# Patient Record
Sex: Male | Born: 1955 | Race: White | Hispanic: No | Marital: Married | Smoking: Never smoker
Health system: Southern US, Community
[De-identification: ages and names within clinical notes are randomized; demographics above are authoritative.]

## PROBLEM LIST (undated history)

## (undated) DIAGNOSIS — R569 Unspecified convulsions: Secondary | ICD-10-CM

## (undated) DIAGNOSIS — I639 Cerebral infarction, unspecified: Secondary | ICD-10-CM

---

## 2005-12-28 ENCOUNTER — Ambulatory Visit: Payer: Self-pay | Admitting: Unknown Physician Specialty

## 2007-08-23 ENCOUNTER — Other Ambulatory Visit: Payer: Self-pay

## 2007-08-23 ENCOUNTER — Inpatient Hospital Stay: Payer: Self-pay | Admitting: Internal Medicine

## 2008-09-21 ENCOUNTER — Emergency Department: Payer: Self-pay | Admitting: Emergency Medicine

## 2010-03-28 ENCOUNTER — Ambulatory Visit: Payer: Self-pay | Admitting: Internal Medicine

## 2010-08-26 ENCOUNTER — Emergency Department: Payer: Self-pay | Admitting: Internal Medicine

## 2016-03-30 ENCOUNTER — Emergency Department
Admission: EM | Admit: 2016-03-30 | Discharge: 2016-03-30 | Disposition: A | Discharge status: Other Acute Inpatient Hospital | Payer: 59 | Attending: Emergency Medicine | Admitting: Emergency Medicine

## 2016-03-30 ENCOUNTER — Encounter: Payer: Self-pay | Admitting: *Deleted

## 2016-03-30 ENCOUNTER — Emergency Department: Payer: 59

## 2016-03-30 DIAGNOSIS — I7103 Dissection of thoracoabdominal aorta: Secondary | ICD-10-CM | POA: Insufficient documentation

## 2016-03-30 DIAGNOSIS — R55 Syncope and collapse: Secondary | ICD-10-CM | POA: Diagnosis present

## 2016-03-30 HISTORY — DX: Cerebral infarction, unspecified: I63.9

## 2016-03-30 HISTORY — DX: Unspecified convulsions: R56.9

## 2016-03-30 LAB — BASIC METABOLIC PANEL
ANION GAP: 3 — AB (ref 5–15)
BUN: 15 mg/dL (ref 6–20)
CALCIUM: 9 mg/dL (ref 8.9–10.3)
CO2: 26 mmol/L (ref 22–32)
Chloride: 108 mmol/L (ref 101–111)
Creatinine, Ser: 0.86 mg/dL (ref 0.61–1.24)
GFR calc Af Amer: >60 mL/min (ref >60)
GFR calc non Af Amer: >60 mL/min (ref >60)
GLUCOSE: 104 mg/dL — AB (ref 65–99)
Potassium: 4.5 mmol/L (ref 3.5–5.1)
Sodium: 137 mmol/L (ref 135–145)

## 2016-03-30 LAB — CBC
HEMATOCRIT: 49 % (ref 40.0–52.0)
HEMOGLOBIN: 16.5 g/dL (ref 13.0–18.0)
MCH: 34.7 pg — ABNORMAL HIGH (ref 26.0–34.0)
MCHC: 33.7 g/dL (ref 32.0–36.0)
MCV: 103 fL — ABNORMAL HIGH (ref 80.0–100.0)
Platelets: 141 10*3/uL — ABNORMAL LOW (ref 150–440)
RBC: 4.75 MIL/uL (ref 4.40–5.90)
RDW: 13.7 % (ref 11.5–14.5)
WBC: 11.2 10*3/uL — ABNORMAL HIGH (ref 3.8–10.6)

## 2016-03-30 LAB — HEPATIC FUNCTION PANEL
ALK PHOS: 80 U/L (ref 38–126)
ALT: 31 U/L (ref 17–63)
AST: 29 U/L (ref 15–41)
Albumin: 4.2 g/dL (ref 3.5–5.0)
BILIRUBIN DIRECT: 0.2 mg/dL (ref 0.1–0.5)
BILIRUBIN INDIRECT: 0.5 mg/dL (ref 0.3–0.9)
BILIRUBIN TOTAL: 0.7 mg/dL (ref 0.3–1.2)
Total Protein: 6.7 g/dL (ref 6.5–8.1)

## 2016-03-30 LAB — TROPONIN I

## 2016-03-30 MED ORDER — FENTANYL CITRATE (PF) 100 MCG/2ML IJ SOLN
25.0000 ug | Freq: Once | INTRAMUSCULAR | Status: AC
  Administered 2016-03-30: 25 ug via INTRAVENOUS
  Filled 2016-03-30: qty 2

## 2016-03-30 MED ORDER — ASPIRIN 81 MG PO CHEW
324.0000 mg | CHEWABLE_TABLET | Freq: Once | ORAL | Status: AC
  Administered 2016-03-30: 324 mg via ORAL
  Filled 2016-03-30: qty 4

## 2016-03-30 MED ORDER — IOPAMIDOL (ISOVUE-370) INJECTION 76%
75.0000 mL | Freq: Once | INTRAVENOUS | Status: AC | PRN
  Administered 2016-03-30: 75 mL via INTRAVENOUS

## 2016-03-30 MED ORDER — SODIUM CHLORIDE 0.9 % IV SOLN
Freq: Once | INTRAVENOUS | Status: AC
  Administered 2016-03-30: 12:00:00 via INTRAVENOUS

## 2016-03-30 NOTE — Progress Notes (Signed)
The Nurse in the ED asked Scott Carr to visit the Pt. Pt suffering from chest pain, wife bedside. Pt transferred to [facility]. [place] provided prayer and presence.    03/30/16 1600  Clinical Encounter Type  Visited With Patient;Patient and family together  Visit Type Initial  Referral From Nurse  Consult/Referral To Chaplain  Spiritual Encounters  Spiritual Needs Prayer

## 2016-03-30 NOTE — ED Triage Notes (Signed)
States this AM he felt a tightness in his throat and then it radiated down to his chest, states he then had a near syncopal episode, states he got up with wifes help and then felt as if he was going to have another near syncopal episode, upon arrival states chest tightness and feeling weak

## 2016-03-30 NOTE — ED Provider Notes (Signed)
[facility] Emergency Department Provider Note  ____________________________________________   First MD Initiated Contact with Patient 03/30/16 1210     (approximate)  I have reviewed the triage vital signs and the nursing notes.   HISTORY  Chief Complaint Chest Pain and Loss of Consciousness   HPI Scott Carr is a 61 y.o. male with a history of CVA as well as seizures on Aggrenox was presented to the emergency department today with an episode of chest pain and near syncope. 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 He says that he felt like he was going to pass out and became very sweaty. There was no vomiting. He says that his pain is still a 6 out of 10 at this time and worse with deep breathing. He says the last time he took his Aggrenox last night which when he typically takes his medicines. He denies any radiation to the back or to the arms. Says that he has bilateral lower extremity swelling.   Past Medical History:  Diagnosis Date  . CVA (cerebral vascular accident) ([place])   . Seizures ([place])     There are no active problems to display for this patient.   History reviewed. No pertinent surgical history.  Prior to Admission medications   Not on File    Allergies Patient has no known allergies.  History reviewed. No pertinent family history.  Social History Social History  Substance Use Topics  . Smoking status: Never Smoker  . Smokeless tobacco: Never Used  . Alcohol use No    Review of Systems Constitutional: No fever/chills Eyes: No visual changes. ENT: No sore throat. Cardiovascular: As above Respiratory: As above Gastrointestinal: No abdominal pain.  No nausea, no vomiting.  No diarrhea.  No constipation. Genitourinary: Negative for dysuria. Musculoskeletal: Negative for back pain. Skin: Negative for rash. Neurological: Negative  for headaches, focal weakness or numbness.  10-point ROS otherwise negative.  ____________________________________________   PHYSICAL EXAM:  VITAL SIGNS: ED Triage Vitals [03/30/16 1150]  Enc Vitals Group     BP (!) 98/53     Pulse Rate 67     Resp 18     Temp 97.8 F (36.6 C)     Temp Source Oral     SpO2 99 %     Weight 198 lb (89.8 kg)     Height 6' (1.829 m)     Head Circumference      Peak Flow      Pain Score 6     Pain Loc      Pain Edu?      Excl. in [place]?     Constitutional: Alert and oriented. Well appearing and in no acute distress. Eyes: Conjunctivae are normal. PERRL. EOMI. Head: Atraumatic. Nose: No congestion/rhinnorhea. Mouth/Throat: Mucous membranes are moist.   Neck: No stridor.   Cardiovascular: Normal rate, regular rhythm. Grossly normal heart sounds.  Good peripheral circulation Is equal bilateral radial as well as dorsalis pedis pulses.  Respiratory: Normal respiratory effort.  No retractions. Lungs CTAB. Gastrointestinal: Soft and nontender. No distention.  Musculoskeletal: No lower extremity tenderness nor edema.  No joint effusions. Neurologic:  Normal speech and language. No gross focal neurologic deficits are appreciated. Skin:  Skin is warm, dry and intact. No rash noted. Psychiatric: Mood and affect are normal. Speech and behavior are normal.  ____________________________________________   LABS (all  labs ordered are listed, but only abnormal results are displayed)  Labs Reviewed  BASIC METABOLIC PANEL - Abnormal; Notable for the following:       Result Value   Glucose, Bld 104 (*)    Anion gap 3 (*)    All other components within normal limits  CBC - Abnormal; Notable for the following:    WBC 11.2 (*)    MCV 103.0 (*)    MCH 34.7 (*)    Platelets 141 (*)    All other components within normal limits  TROPONIN I  HEPATIC FUNCTION PANEL   ____________________________________________  EKG  ED ECG REPORT I, Doran Stabler, the attending physician, personally viewed and interpreted this ECG.   Date: 03/30/2016  EKG Time: 1152  Rate: 65  Rhythm: normal sinus rhythm  Axis: Normal axis  Intervals: Normal  ST&T Change: No ST elevation or depression. No abnormal T-wave inversion.  ____________________________________________  RADIOLOGY    CT Angio Chest PE W and/or Wo Contrast (Final result)  Result time 03/30/16 13:58:15  Final result by San Morelle, MD (03/30/16 13:58:15)           Narrative:   CLINICAL DATA: Chest tightness. Weakness. Near syncopal episode.  The initial CTA chest the pulmonary arteries was reviewed with suspicion of aortic dissection. The patient was then brought back to the scanner for  EXAM: CT ANGIOGRAPHY CHEST WITH CONTRAST  TECHNIQUE: Multidetector CT imaging of the chest was performed using the standard protocol during bolus administration of intravenous contrast. Multiplanar CT image reconstructions and MIPs were obtained to evaluate the vascular anatomy.  CONTRAST: 150 mL Isovue 370  COMPARISON: Two-view chest x-[place] from the same day.  FINDINGS: Cardiovascular: The heart size is normal. A type a aortic dissection is present. The dissection begins just above the aortic valves. And extends into the great vessels with involvement of the innominate artery, the left internal carotid artery, and the left subclavian artery. The vertebral arteries both fill. The left vertebral artery fills from the native lumen. The dissection extends most distally in the left subclavian artery.  Displace calcifications are present in the descending thoracic aorta. The dissection extends into the abdomen. The celiac artery and superior mesenteric artery fill from the native lumen.  There is no leak.  The ascending aorta is aneurysmal, measuring 5.2 cm.  There is no pulmonary embolus.  Mediastinum/Nodes: No significant mediastinal or axillary adenopathy is  present.  Lungs/Pleura: Lungs are clear. Minimal dependent atelectasis is present bilaterally. There is no pneumothorax. No focal airspace disease is present. There is no nodule or mass lesion.  Upper Abdomen: A simple cyst is present at the upper pole of the left kidney measuring 2.2 cm. No other focal lesions are evident.  Musculoskeletal: Mild degenerative changes are present in thoracic spine. Superior endplate Schmorl's nodes or remote superior endplate fractures are present T4, T5, and T6. No acute fractures present.  Review of the MIP images confirms the above findings.  IMPRESSION: 1. Type a aortic dissection beginning just above the aortic valve some extending to the lowest imaged level, the superior mesenteric artery. 2. The dissection extends into the great vessels, most distally in the left subclavian artery. 3. Focal clot within the dissection flap of the left internal carotid artery measures 13 mm. 4. No pulmonary embolus. 5. Minimal dependent atelectasis. These results were called by telephone at the time of interpretation on 03/30/2016 at 1:57 pm to Dr. Jimmye Norman , who verbally acknowledged these results.  Electronically Signed By: San Morelle M.D. On: 03/30/2016 13:58            DG Chest 2 View (Final result)  Result time 03/30/16 12:13:34  Final result by Gus Height, MD (03/30/16 12:13:34)           Narrative:   CLINICAL DATA: States this AM he felt a tightness in his throat and then it radiated down to his chest; he then had a near syncopal episode; states he got up with wifes help and then felt as if he was going to have another near syncopal episode.*comment was truncated*  EXAM: CHEST 2 VIEW  COMPARISON: None.  FINDINGS: The mediastinum and cardiac silhouette. No effusion, infiltrate, pneumothorax. No pleural fluid. Degenerative osteophytosis of the spine.  IMPRESSION: No acute cardiopulmonary  process.   Electronically Signed By: Suzy Bouchard M.D. On: 03/30/2016 12:13          ____________________________________________   PROCEDURES  Procedure(s) performed:   CRITICAL CARE Performed by: Doran Stabler   Total critical care time: 35 minutes  Critical care time was exclusive of separately billable procedures and treating other patients.  Critical care was necessary to treat or prevent imminent or life-threatening deterioration.  Critical care was time spent personally by me on the following activities: development of treatment plan with patient and/or surrogate as well as nursing, discussions with consultants, evaluation of patient's response to treatment, examination of patient, obtaining history from patient or surrogate, ordering and performing treatments and interventions, ordering and review of laboratory studies, ordering and review of radiographic studies, pulse oximetry and re-evaluation of patient's condition.   Procedures  Critical Care performed:   ____________________________________________   INITIAL IMPRESSION / ASSESSMENT AND PLAN / ED COURSE  Pertinent labs & imaging results that were available during my care of the patient were reviewed by me and considered in my medical decision making (see chart for details).  Clinical Course   ----------------------------------------- 2:05 PM on 03/30/2016 -----------------------------------------  Patient found to have a type A aortic dissection. Heart rate in the 60s and blood pressure in the 100s over 50s to 60s. Discussed the case with Dr. Ysidro Evert of cardiothoracic surgery at [facility]. He accepts patient for transfer. The plan will be to fly the patient via helicopter. The patient is still experienced chest pain at this time. We will re-dose fentanyl.  I explained the diagnosis as well as the severity of the diagnosis to the patient as well as the family who are at the bedside. He  is understanding of the plan for emergent transfer. ____________________________________________   FINAL CLINICAL IMPRESSION(S) / ED DIAGNOSES  Type A aortic dissection.    NEW MEDICATIONS STARTED DURING THIS VISIT:  New Prescriptions   No medications on file     Note:  This document was prepared using Dragon voice recognition software and may include unintentional dictation errors.    Orbie Pyo, MD 03/30/16 [phone]

## 2016-03-30 NOTE — ED Notes (Signed)
Pt placed on 2L [place]

## 2016-03-30 NOTE — ED Notes (Signed)
ED Provider at bedside. 

## 2017-03-28 DIAGNOSIS — M75121 Complete rotator cuff tear or rupture of right shoulder, not specified as traumatic: Secondary | ICD-10-CM | POA: Diagnosis not present

## 2017-04-01 ENCOUNTER — Other Ambulatory Visit: Payer: Self-pay | Admitting: Orthopedic Surgery

## 2017-04-01 DIAGNOSIS — M75121 Complete rotator cuff tear or rupture of right shoulder, not specified as traumatic: Secondary | ICD-10-CM

## 2017-04-16 ENCOUNTER — Ambulatory Visit
Admission: RE | Admit: 2017-04-16 | Discharge: 2017-04-16 | Disposition: A | Discharge status: Home / Self Care | Payer: 59 | Source: Ambulatory Visit | Attending: Orthopedic Surgery | Admitting: Orthopedic Surgery

## 2017-04-16 DIAGNOSIS — M75121 Complete rotator cuff tear or rupture of right shoulder, not specified as traumatic: Secondary | ICD-10-CM

## 2017-04-19 DIAGNOSIS — I351 Nonrheumatic aortic (valve) insufficiency: Secondary | ICD-10-CM | POA: Diagnosis not present

## 2017-04-19 DIAGNOSIS — Z9889 Other specified postprocedural states: Secondary | ICD-10-CM | POA: Diagnosis not present

## 2017-04-19 DIAGNOSIS — R0609 Other forms of dyspnea: Secondary | ICD-10-CM | POA: Diagnosis not present

## 2017-04-19 DIAGNOSIS — I712 Thoracic aortic aneurysm, without rupture: Secondary | ICD-10-CM | POA: Diagnosis not present

## 2017-04-19 DIAGNOSIS — Z8679 Personal history of other diseases of the circulatory system: Secondary | ICD-10-CM | POA: Diagnosis not present

## 2017-04-25 DIAGNOSIS — Z01818 Encounter for other preprocedural examination: Secondary | ICD-10-CM | POA: Diagnosis not present

## 2017-04-25 DIAGNOSIS — Z01810 Encounter for preprocedural cardiovascular examination: Secondary | ICD-10-CM | POA: Diagnosis not present

## 2017-04-25 DIAGNOSIS — R0602 Shortness of breath: Secondary | ICD-10-CM | POA: Diagnosis not present

## 2017-04-25 DIAGNOSIS — I517 Cardiomegaly: Secondary | ICD-10-CM | POA: Diagnosis not present

## 2017-04-25 DIAGNOSIS — Z9889 Other specified postprocedural states: Secondary | ICD-10-CM | POA: Diagnosis not present

## 2017-05-03 DIAGNOSIS — Z8679 Personal history of other diseases of the circulatory system: Secondary | ICD-10-CM | POA: Diagnosis not present

## 2017-05-03 DIAGNOSIS — R0609 Other forms of dyspnea: Secondary | ICD-10-CM | POA: Diagnosis not present

## 2017-05-03 DIAGNOSIS — I712 Thoracic aortic aneurysm, without rupture: Secondary | ICD-10-CM | POA: Diagnosis not present

## 2017-05-03 DIAGNOSIS — Z9889 Other specified postprocedural states: Secondary | ICD-10-CM | POA: Diagnosis not present

## 2017-05-03 DIAGNOSIS — I716 Thoracoabdominal aortic aneurysm, without rupture: Secondary | ICD-10-CM | POA: Diagnosis not present

## 2017-06-04 DIAGNOSIS — I480 Paroxysmal atrial fibrillation: Secondary | ICD-10-CM | POA: Diagnosis not present

## 2017-06-04 DIAGNOSIS — I712 Thoracic aortic aneurysm, without rupture: Secondary | ICD-10-CM | POA: Diagnosis not present

## 2017-06-04 DIAGNOSIS — I509 Heart failure, unspecified: Secondary | ICD-10-CM | POA: Diagnosis not present

## 2017-06-04 DIAGNOSIS — J90 Pleural effusion, not elsewhere classified: Secondary | ICD-10-CM | POA: Diagnosis not present

## 2017-06-04 DIAGNOSIS — I97190 Other postprocedural cardiac functional disturbances following cardiac surgery: Secondary | ICD-10-CM | POA: Diagnosis not present

## 2017-06-04 DIAGNOSIS — Z8679 Personal history of other diseases of the circulatory system: Secondary | ICD-10-CM | POA: Diagnosis not present

## 2017-06-04 DIAGNOSIS — I5032 Chronic diastolic (congestive) heart failure: Secondary | ICD-10-CM | POA: Diagnosis not present

## 2017-06-04 DIAGNOSIS — I7101 Dissection of thoracic aorta: Secondary | ICD-10-CM | POA: Diagnosis not present

## 2017-06-04 DIAGNOSIS — I21A1 Myocardial infarction type 2: Secondary | ICD-10-CM | POA: Diagnosis not present

## 2017-06-04 DIAGNOSIS — Z789 Other specified health status: Secondary | ICD-10-CM | POA: Diagnosis not present

## 2017-06-04 DIAGNOSIS — Z952 Presence of prosthetic heart valve: Secondary | ICD-10-CM | POA: Diagnosis not present

## 2017-06-04 DIAGNOSIS — R0789 Other chest pain: Secondary | ICD-10-CM | POA: Diagnosis not present

## 2017-06-04 DIAGNOSIS — J939 Pneumothorax, unspecified: Secondary | ICD-10-CM | POA: Diagnosis not present

## 2017-06-04 DIAGNOSIS — Z9889 Other specified postprocedural states: Secondary | ICD-10-CM | POA: Diagnosis not present

## 2017-06-04 DIAGNOSIS — R918 Other nonspecific abnormal finding of lung field: Secondary | ICD-10-CM | POA: Diagnosis not present

## 2017-06-04 DIAGNOSIS — I7103 Dissection of thoracoabdominal aorta: Secondary | ICD-10-CM | POA: Diagnosis not present

## 2017-06-04 DIAGNOSIS — J9811 Atelectasis: Secondary | ICD-10-CM | POA: Diagnosis not present

## 2017-06-04 DIAGNOSIS — Z4682 Encounter for fitting and adjustment of non-vascular catheter: Secondary | ICD-10-CM | POA: Diagnosis not present

## 2017-06-04 DIAGNOSIS — J9601 Acute respiratory failure with hypoxia: Secondary | ICD-10-CM | POA: Diagnosis not present

## 2017-06-04 DIAGNOSIS — Z452 Encounter for adjustment and management of vascular access device: Secondary | ICD-10-CM | POA: Diagnosis not present

## 2017-06-04 DIAGNOSIS — I716 Thoracoabdominal aortic aneurysm, without rupture: Secondary | ICD-10-CM | POA: Diagnosis not present

## 2017-06-04 DIAGNOSIS — Q2543 Congenital aneurysm of aorta: Secondary | ICD-10-CM | POA: Diagnosis not present

## 2017-06-04 DIAGNOSIS — J982 Interstitial emphysema: Secondary | ICD-10-CM | POA: Diagnosis not present

## 2017-06-04 DIAGNOSIS — I10 Essential (primary) hypertension: Secondary | ICD-10-CM | POA: Diagnosis not present

## 2017-06-04 DIAGNOSIS — I351 Nonrheumatic aortic (valve) insufficiency: Secondary | ICD-10-CM | POA: Diagnosis not present

## 2017-06-04 DIAGNOSIS — J948 Other specified pleural conditions: Secondary | ICD-10-CM | POA: Diagnosis not present

## 2017-06-04 DIAGNOSIS — I7102 Dissection of abdominal aorta: Secondary | ICD-10-CM | POA: Diagnosis not present

## 2017-06-20 IMAGING — CT CT ANGIO CHEST
3 of 19 series · 13 of 48 positions shown · IV contrast (APPLIED)
Comparison: Two-view chest x-ray from the same day.

CLINICAL DATA: Chest tightness.  Weakness.  Near syncopal episode.

The initial CTA chest the pulmonary arteries was reviewed with
suspicion of aortic dissection. The patient was then brought back to
the scanner for
EXAM:
CT ANGIOGRAPHY CHEST WITH CONTRAST
TECHNIQUE: Multidetector CT imaging of the chest was performed using the
standard protocol during bolus administration of intravenous
contrast. Multiplanar CT image reconstructions and MIPs were
obtained to evaluate the vascular anatomy.
CONTRAST:  150 mL Isovue 370

[Series 5: thins · axial · 0.74mm/px · z∈[-466,-241]mm · 8 of 283 slices shown]
[im 29/283  lung]
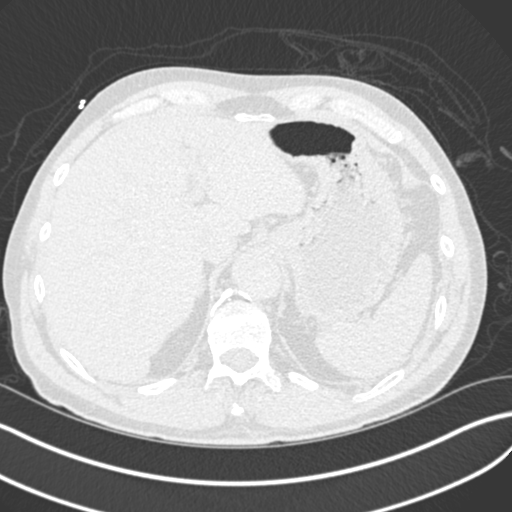
[im 57/283  lung]
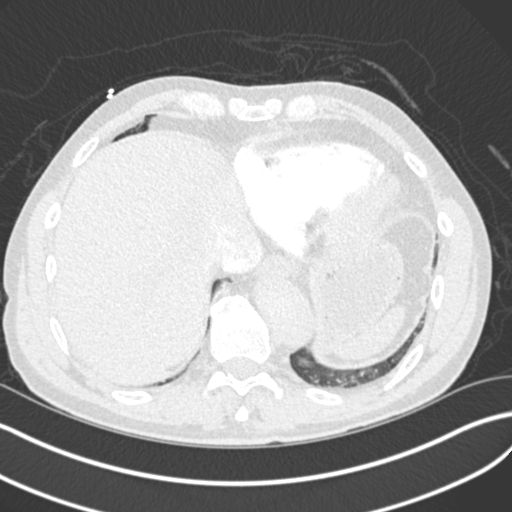
[im 85/283  lung]
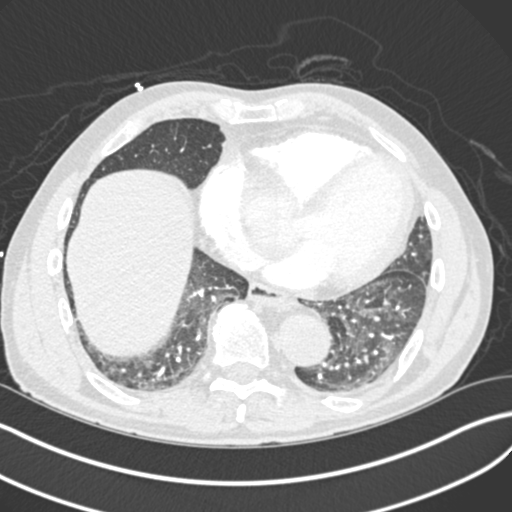
[im 113/283  lung]
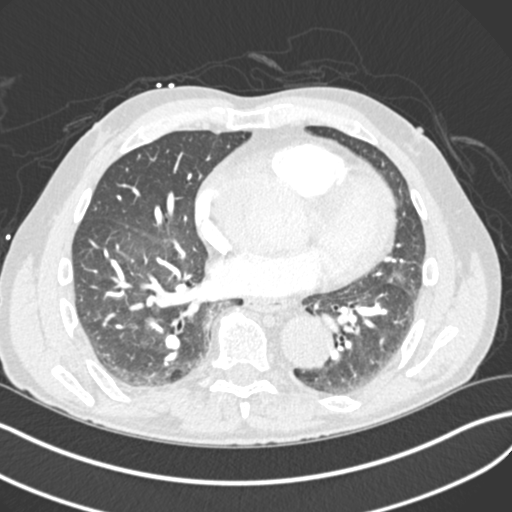
[im 170/283  lung]
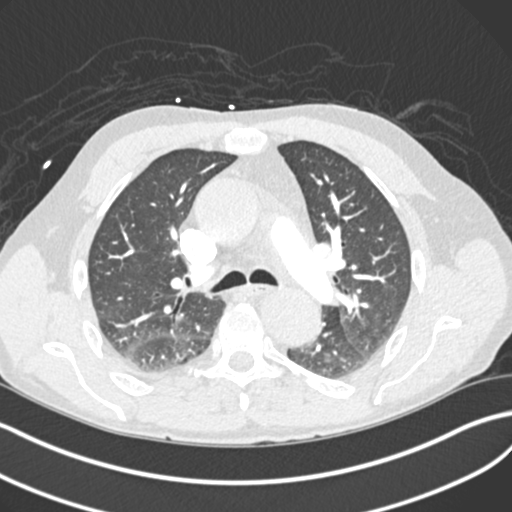
[im 198/283  lung]
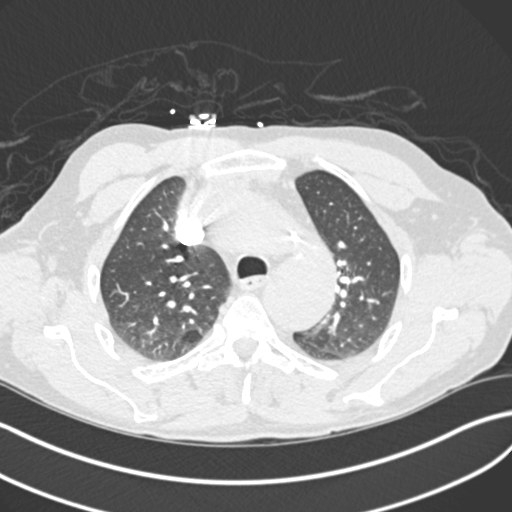
[im 226/283  lung]
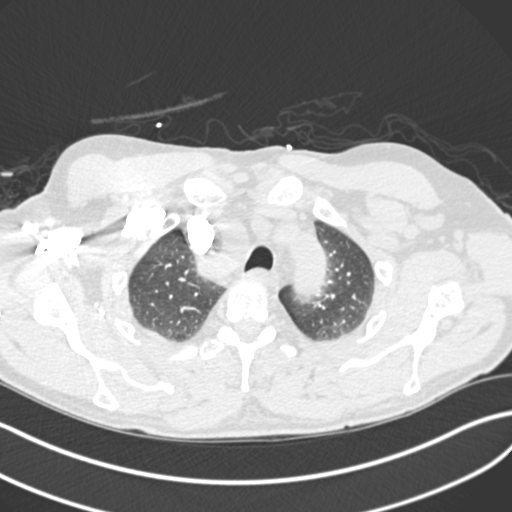
[im 254/283  lung]
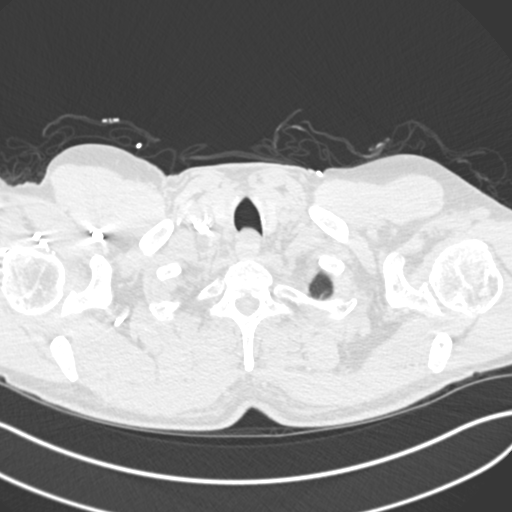

[Series 6: lung · axial · 0.74mm/px · z∈[-383,-299]mm · 2 of 86 slices shown]
[im 29/86  soft-tissue]
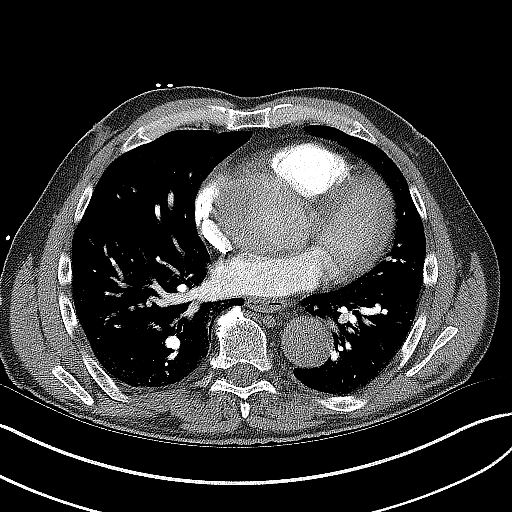
[im 57/86  soft-tissue]
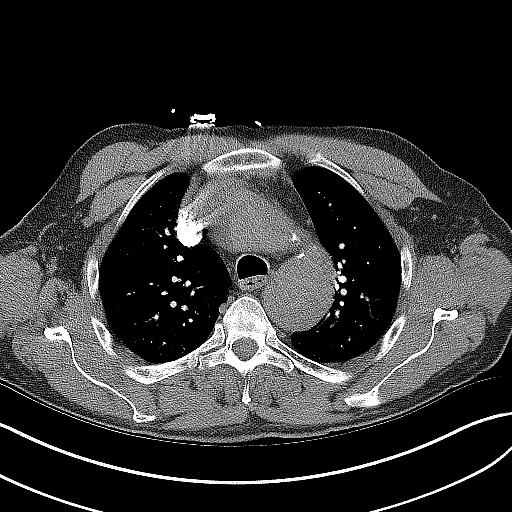

[Series 15: axial arterial · axial · arterial · 0.73mm/px · z∈[-449,-293]mm · 3 of 106 slices shown]
[im 27/106  lung]
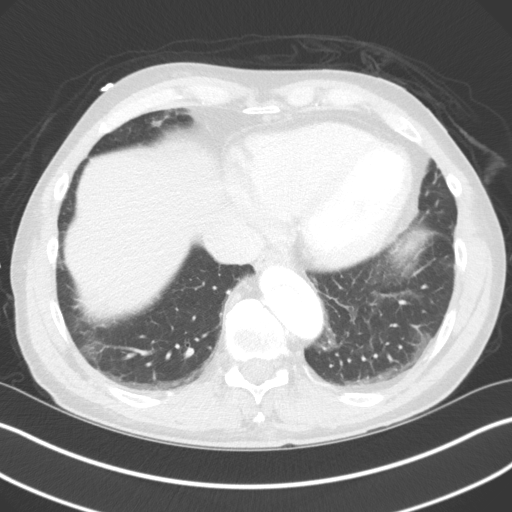
[im 53/106  soft-tissue]
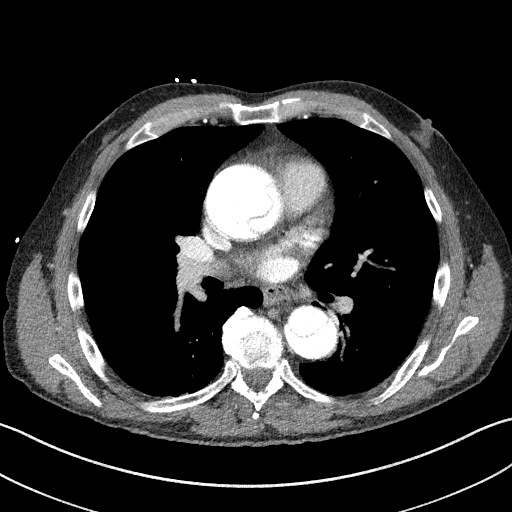
[im 79/106  lung]
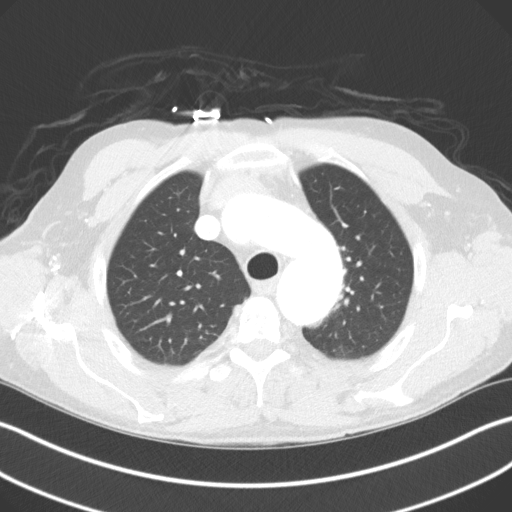

[13 of 48 positions shown; findings below may reference images not displayed]

FINDINGS: Cardiovascular: The heart size is normal. A type a aortic dissection
is present. The dissection begins just above the aortic valves. And
extends into the great vessels with involvement of the innominate
artery, the left internal carotid artery, and the left subclavian
artery. The vertebral arteries both fill. The left vertebral artery
fills from the native lumen. The dissection extends most distally in
the left subclavian artery.

Displace calcifications are present in the descending thoracic
aorta. The dissection extends into the abdomen. The celiac artery
and superior mesenteric artery fill from the native lumen.

There is no leak.

The ascending aorta is aneurysmal, measuring 5.2 cm.

There is no pulmonary embolus.

Mediastinum/Nodes: No significant mediastinal or axillary adenopathy
is present.

Lungs/Pleura: Lungs are clear. Minimal dependent atelectasis is
present bilaterally. There is no pneumothorax. No focal airspace
disease is present. There is no nodule or mass lesion.

Upper Abdomen: A simple cyst is present at the upper pole of the
left kidney measuring 2.2 cm. No other focal lesions are evident.

Musculoskeletal: Mild degenerative changes are present in thoracic
spine. Superior endplate Schmorl's nodes or remote superior endplate
fractures are present T4, T5, and T6. No acute fractures present.

Review of the MIP images confirms the above findings.
IMPRESSION: 1. Type a aortic dissection beginning just above the aortic valve
some extending to the lowest imaged level, the superior mesenteric
artery.
2. The dissection extends into the great vessels, most distally in
the left subclavian artery.
3. Focal clot within the dissection flap of the left internal
carotid artery measures 13 mm.
4. No pulmonary embolus.
5. Minimal dependent atelectasis.
These results were called by telephone at the time of interpretation
on 03/30/2016 at [DATE] to Dr. Maynor , who verbally acknowledged
these results.

## 2017-06-21 MED ORDER — MELATONIN 3 MG PO TBDP
6.00 | ORAL_TABLET | ORAL | Status: DC
Start: 2017-06-21 — End: 2017-06-21

## 2017-06-21 MED ORDER — WARFARIN SODIUM 2 MG PO TABS
2.00 | ORAL_TABLET | ORAL | Status: DC
Start: ? — End: 2017-06-21

## 2017-06-21 MED ORDER — SENNOSIDES-DOCUSATE SODIUM 8.6-50 MG PO TABS
2.00 | ORAL_TABLET | ORAL | Status: DC
Start: ? — End: 2017-06-21

## 2017-06-21 MED ORDER — TRAZODONE HCL 50 MG PO TABS
50.00 | ORAL_TABLET | ORAL | Status: DC
Start: ? — End: 2017-06-21

## 2017-06-21 MED ORDER — SPIRONOLACTONE 25 MG PO TABS
25.00 | ORAL_TABLET | ORAL | Status: DC
Start: 2017-06-22 — End: 2017-06-21

## 2017-06-21 MED ORDER — ACETAMINOPHEN 325 MG PO TABS
650.00 | ORAL_TABLET | ORAL | Status: DC
Start: 2017-06-21 — End: 2017-06-21

## 2017-06-21 MED ORDER — OXYCODONE HCL 5 MG PO TABS
5.00 | ORAL_TABLET | ORAL | Status: DC
Start: ? — End: 2017-06-21

## 2017-06-21 MED ORDER — AMIODARONE HCL 200 MG PO TABS
200.00 | ORAL_TABLET | ORAL | Status: DC
Start: ? — End: 2017-06-21

## 2017-06-21 MED ORDER — POLYETHYLENE GLYCOL 3350 17 G PO PACK
17.00 | PACK | ORAL | Status: DC
Start: ? — End: 2017-06-21

## 2017-06-21 MED ORDER — PHENYTOIN SODIUM EXTENDED 100 MG PO CAPS
400.00 | ORAL_CAPSULE | ORAL | Status: DC
Start: 2017-06-21 — End: 2017-06-21

## 2017-06-21 MED ORDER — ATORVASTATIN CALCIUM 40 MG PO TABS
40.00 | ORAL_TABLET | ORAL | Status: DC
Start: 2017-06-21 — End: 2017-06-21

## 2017-06-21 MED ORDER — LIDOCAINE 5 % EX PTCH
2.00 | MEDICATED_PATCH | CUTANEOUS | Status: DC
Start: 2017-06-22 — End: 2017-06-21

## 2017-06-21 MED ORDER — LEVETIRACETAM 500 MG PO TABS
1000.00 | ORAL_TABLET | ORAL | Status: DC
Start: 2017-06-21 — End: 2017-06-21

## 2017-06-21 MED ORDER — FUROSEMIDE 40 MG PO TABS
40.00 | ORAL_TABLET | ORAL | Status: DC
Start: 2017-06-21 — End: 2017-06-21

## 2017-06-21 MED ORDER — ASPIRIN EC 81 MG PO TBEC
81.00 | DELAYED_RELEASE_TABLET | ORAL | Status: DC
Start: 2017-06-22 — End: 2017-06-21

## 2017-06-21 MED ORDER — GENERIC EXTERNAL MEDICATION
Status: DC
Start: ? — End: 2017-06-21

## 2017-06-21 MED ORDER — METOPROLOL TARTRATE 25 MG PO TABS
25.00 | ORAL_TABLET | ORAL | Status: DC
Start: 2017-06-21 — End: 2017-06-21

## 2017-06-22 DIAGNOSIS — Z48812 Encounter for surgical aftercare following surgery on the circulatory system: Secondary | ICD-10-CM | POA: Diagnosis not present

## 2017-06-22 DIAGNOSIS — I716 Thoracoabdominal aortic aneurysm, without rupture: Secondary | ICD-10-CM | POA: Diagnosis not present

## 2017-06-22 DIAGNOSIS — I7789 Other specified disorders of arteries and arterioles: Secondary | ICD-10-CM | POA: Diagnosis not present

## 2017-06-24 DIAGNOSIS — I480 Paroxysmal atrial fibrillation: Secondary | ICD-10-CM | POA: Diagnosis not present

## 2017-06-24 DIAGNOSIS — I716 Thoracoabdominal aortic aneurysm, without rupture: Secondary | ICD-10-CM | POA: Diagnosis not present

## 2017-06-24 DIAGNOSIS — Z48812 Encounter for surgical aftercare following surgery on the circulatory system: Secondary | ICD-10-CM | POA: Diagnosis not present

## 2017-06-24 DIAGNOSIS — I7789 Other specified disorders of arteries and arterioles: Secondary | ICD-10-CM | POA: Diagnosis not present

## 2017-06-24 DIAGNOSIS — Z09 Encounter for follow-up examination after completed treatment for conditions other than malignant neoplasm: Secondary | ICD-10-CM | POA: Diagnosis not present

## 2017-07-01 DIAGNOSIS — E785 Hyperlipidemia, unspecified: Secondary | ICD-10-CM | POA: Diagnosis not present

## 2017-07-02 DIAGNOSIS — Z9889 Other specified postprocedural states: Secondary | ICD-10-CM | POA: Diagnosis not present

## 2017-07-02 DIAGNOSIS — R918 Other nonspecific abnormal finding of lung field: Secondary | ICD-10-CM | POA: Diagnosis not present

## 2017-07-02 DIAGNOSIS — I7103 Dissection of thoracoabdominal aorta: Secondary | ICD-10-CM | POA: Diagnosis not present

## 2017-07-02 DIAGNOSIS — Z8679 Personal history of other diseases of the circulatory system: Secondary | ICD-10-CM | POA: Diagnosis not present

## 2017-07-17 DIAGNOSIS — I10 Essential (primary) hypertension: Secondary | ICD-10-CM | POA: Diagnosis not present

## 2017-07-17 DIAGNOSIS — I480 Paroxysmal atrial fibrillation: Secondary | ICD-10-CM | POA: Diagnosis not present

## 2017-07-17 DIAGNOSIS — E785 Hyperlipidemia, unspecified: Secondary | ICD-10-CM | POA: Diagnosis not present

## 2017-07-24 DIAGNOSIS — I480 Paroxysmal atrial fibrillation: Secondary | ICD-10-CM | POA: Diagnosis not present

## 2017-07-24 DIAGNOSIS — I6930 Unspecified sequelae of cerebral infarction: Secondary | ICD-10-CM | POA: Diagnosis not present

## 2017-07-24 DIAGNOSIS — D696 Thrombocytopenia, unspecified: Secondary | ICD-10-CM | POA: Diagnosis not present

## 2017-07-24 DIAGNOSIS — Z9889 Other specified postprocedural states: Secondary | ICD-10-CM | POA: Diagnosis not present

## 2017-07-29 DIAGNOSIS — I6930 Unspecified sequelae of cerebral infarction: Secondary | ICD-10-CM | POA: Diagnosis not present

## 2017-07-29 DIAGNOSIS — D696 Thrombocytopenia, unspecified: Secondary | ICD-10-CM | POA: Diagnosis not present

## 2017-07-29 DIAGNOSIS — I480 Paroxysmal atrial fibrillation: Secondary | ICD-10-CM | POA: Diagnosis not present

## 2017-07-31 DIAGNOSIS — N390 Urinary tract infection, site not specified: Secondary | ICD-10-CM | POA: Diagnosis not present

## 2017-08-06 DIAGNOSIS — D696 Thrombocytopenia, unspecified: Secondary | ICD-10-CM | POA: Diagnosis not present

## 2017-08-06 DIAGNOSIS — I480 Paroxysmal atrial fibrillation: Secondary | ICD-10-CM | POA: Diagnosis not present

## 2017-08-06 DIAGNOSIS — I6930 Unspecified sequelae of cerebral infarction: Secondary | ICD-10-CM | POA: Diagnosis not present

## 2017-08-12 DIAGNOSIS — I6930 Unspecified sequelae of cerebral infarction: Secondary | ICD-10-CM | POA: Diagnosis not present

## 2017-08-12 DIAGNOSIS — D696 Thrombocytopenia, unspecified: Secondary | ICD-10-CM | POA: Diagnosis not present

## 2017-08-12 DIAGNOSIS — I480 Paroxysmal atrial fibrillation: Secondary | ICD-10-CM | POA: Diagnosis not present

## 2017-08-12 DIAGNOSIS — Z9889 Other specified postprocedural states: Secondary | ICD-10-CM | POA: Diagnosis not present

## 2017-08-14 DIAGNOSIS — Z8679 Personal history of other diseases of the circulatory system: Secondary | ICD-10-CM | POA: Diagnosis not present

## 2017-08-14 DIAGNOSIS — Z9889 Other specified postprocedural states: Secondary | ICD-10-CM | POA: Diagnosis not present

## 2017-08-14 DIAGNOSIS — I6930 Unspecified sequelae of cerebral infarction: Secondary | ICD-10-CM | POA: Diagnosis not present

## 2017-08-14 DIAGNOSIS — J90 Pleural effusion, not elsewhere classified: Secondary | ICD-10-CM | POA: Diagnosis not present

## 2017-08-14 DIAGNOSIS — I517 Cardiomegaly: Secondary | ICD-10-CM | POA: Diagnosis not present

## 2017-08-14 DIAGNOSIS — J811 Chronic pulmonary edema: Secondary | ICD-10-CM | POA: Diagnosis not present

## 2017-10-11 DIAGNOSIS — I7103 Dissection of thoracoabdominal aorta: Secondary | ICD-10-CM | POA: Diagnosis not present

## 2017-10-11 DIAGNOSIS — Z952 Presence of prosthetic heart valve: Secondary | ICD-10-CM | POA: Diagnosis not present

## 2017-10-11 DIAGNOSIS — I7101 Dissection of thoracic aorta: Secondary | ICD-10-CM | POA: Diagnosis not present

## 2017-10-14 DIAGNOSIS — M19042 Primary osteoarthritis, left hand: Secondary | ICD-10-CM | POA: Diagnosis not present

## 2017-10-14 DIAGNOSIS — S61022A Laceration with foreign body of left thumb without damage to nail, initial encounter: Secondary | ICD-10-CM | POA: Diagnosis not present

## 2017-10-24 DIAGNOSIS — I10 Essential (primary) hypertension: Secondary | ICD-10-CM | POA: Diagnosis not present

## 2017-10-24 DIAGNOSIS — I480 Paroxysmal atrial fibrillation: Secondary | ICD-10-CM | POA: Diagnosis not present

## 2017-10-24 DIAGNOSIS — Z0000 Encounter for general adult medical examination without abnormal findings: Secondary | ICD-10-CM | POA: Diagnosis not present

## 2017-11-01 DIAGNOSIS — I712 Thoracic aortic aneurysm, without rupture: Secondary | ICD-10-CM | POA: Diagnosis not present

## 2017-11-01 DIAGNOSIS — I480 Paroxysmal atrial fibrillation: Secondary | ICD-10-CM | POA: Diagnosis not present

## 2017-11-01 DIAGNOSIS — Z9889 Other specified postprocedural states: Secondary | ICD-10-CM | POA: Diagnosis not present

## 2017-11-14 DIAGNOSIS — I63139 Cerebral infarction due to embolism of unspecified carotid artery: Secondary | ICD-10-CM | POA: Diagnosis not present

## 2017-11-14 DIAGNOSIS — I639 Cerebral infarction, unspecified: Secondary | ICD-10-CM | POA: Diagnosis not present

## 2017-12-17 DIAGNOSIS — Z79899 Other long term (current) drug therapy: Secondary | ICD-10-CM | POA: Diagnosis not present

## 2018-01-13 DIAGNOSIS — L570 Actinic keratosis: Secondary | ICD-10-CM | POA: Diagnosis not present

## 2018-01-13 DIAGNOSIS — Z85828 Personal history of other malignant neoplasm of skin: Secondary | ICD-10-CM | POA: Diagnosis not present

## 2018-01-13 DIAGNOSIS — Z1283 Encounter for screening for malignant neoplasm of skin: Secondary | ICD-10-CM | POA: Diagnosis not present

## 2018-01-13 DIAGNOSIS — X32XXXA Exposure to sunlight, initial encounter: Secondary | ICD-10-CM | POA: Diagnosis not present

## 2018-01-13 DIAGNOSIS — D225 Melanocytic nevi of trunk: Secondary | ICD-10-CM | POA: Diagnosis not present

## 2018-02-25 DIAGNOSIS — L728 Other follicular cysts of the skin and subcutaneous tissue: Secondary | ICD-10-CM | POA: Diagnosis not present

## 2018-03-05 DIAGNOSIS — I6930 Unspecified sequelae of cerebral infarction: Secondary | ICD-10-CM | POA: Diagnosis not present

## 2018-03-05 DIAGNOSIS — Z9889 Other specified postprocedural states: Secondary | ICD-10-CM | POA: Diagnosis not present

## 2018-03-05 DIAGNOSIS — Z8679 Personal history of other diseases of the circulatory system: Secondary | ICD-10-CM | POA: Diagnosis not present

## 2018-03-05 DIAGNOSIS — R0781 Pleurodynia: Secondary | ICD-10-CM | POA: Diagnosis not present

## 2018-03-05 DIAGNOSIS — G459 Transient cerebral ischemic attack, unspecified: Secondary | ICD-10-CM | POA: Diagnosis not present

## 2018-03-05 DIAGNOSIS — I10 Essential (primary) hypertension: Secondary | ICD-10-CM | POA: Diagnosis not present

## 2018-03-11 DIAGNOSIS — I080 Rheumatic disorders of both mitral and aortic valves: Secondary | ICD-10-CM | POA: Diagnosis not present

## 2018-03-11 DIAGNOSIS — I371 Nonrheumatic pulmonary valve insufficiency: Secondary | ICD-10-CM | POA: Diagnosis not present

## 2018-03-11 DIAGNOSIS — Z8679 Personal history of other diseases of the circulatory system: Secondary | ICD-10-CM | POA: Diagnosis not present

## 2018-03-11 DIAGNOSIS — Z9889 Other specified postprocedural states: Secondary | ICD-10-CM | POA: Diagnosis not present
